# Patient Record
Sex: Female | Born: 1960 | Race: White | Hispanic: No | Smoking: Current every day smoker
Health system: Southern US, Community
[De-identification: ages and names within clinical notes are randomized; demographics above are authoritative.]

## PROBLEM LIST (undated history)

## (undated) HISTORY — PX: CHOLECYSTECTOMY: SHX55

## (undated) HISTORY — PX: TONSILLECTOMY: SUR1361

---

## 1997-09-06 ENCOUNTER — Emergency Department (HOSPITAL_COMMUNITY): Admission: EM | Admit: 1997-09-06 | Discharge: 1997-09-06 | Payer: Self-pay | Admitting: Emergency Medicine

## 1998-09-28 ENCOUNTER — Ambulatory Visit (HOSPITAL_COMMUNITY): Admission: RE | Admit: 1998-09-28 | Discharge: 1998-09-28 | Payer: Self-pay | Admitting: Gastroenterology

## 1999-08-03 ENCOUNTER — Emergency Department (HOSPITAL_COMMUNITY): Admission: EM | Admit: 1999-08-03 | Discharge: 1999-08-03 | Payer: Self-pay

## 1999-09-12 ENCOUNTER — Encounter: Payer: Self-pay | Admitting: Gastroenterology

## 1999-09-12 ENCOUNTER — Ambulatory Visit (HOSPITAL_COMMUNITY): Admission: RE | Admit: 1999-09-12 | Discharge: 1999-09-12 | Payer: Self-pay | Admitting: Gastroenterology

## 1999-09-29 ENCOUNTER — Ambulatory Visit (HOSPITAL_COMMUNITY): Admission: RE | Admit: 1999-09-29 | Discharge: 1999-09-30 | Payer: Self-pay | Admitting: General Surgery

## 1999-09-29 ENCOUNTER — Encounter: Payer: Self-pay | Admitting: General Surgery

## 2006-11-25 ENCOUNTER — Emergency Department: Payer: Self-pay | Admitting: Emergency Medicine

## 2008-08-19 IMAGING — CR RIGHT MIDDLE FINGER 2+V
1 series · 3 of 3 positions shown · non-contrast
Comparison: none

REASON FOR EXAM: injury
COMMENTS:   LMP: Post-Menopausal

PROCEDURE:     DXR - DXR FINGER MID 3RD DIGIT RT HAND  - November 25, 2006 [DATE]
RESULT:     A distal tuft fracture is present.  There is a soft tissue
laceration.

[Series 1: view not recorded · 0.17mm/px · 3 of 3 slices shown]
[im 1/3]
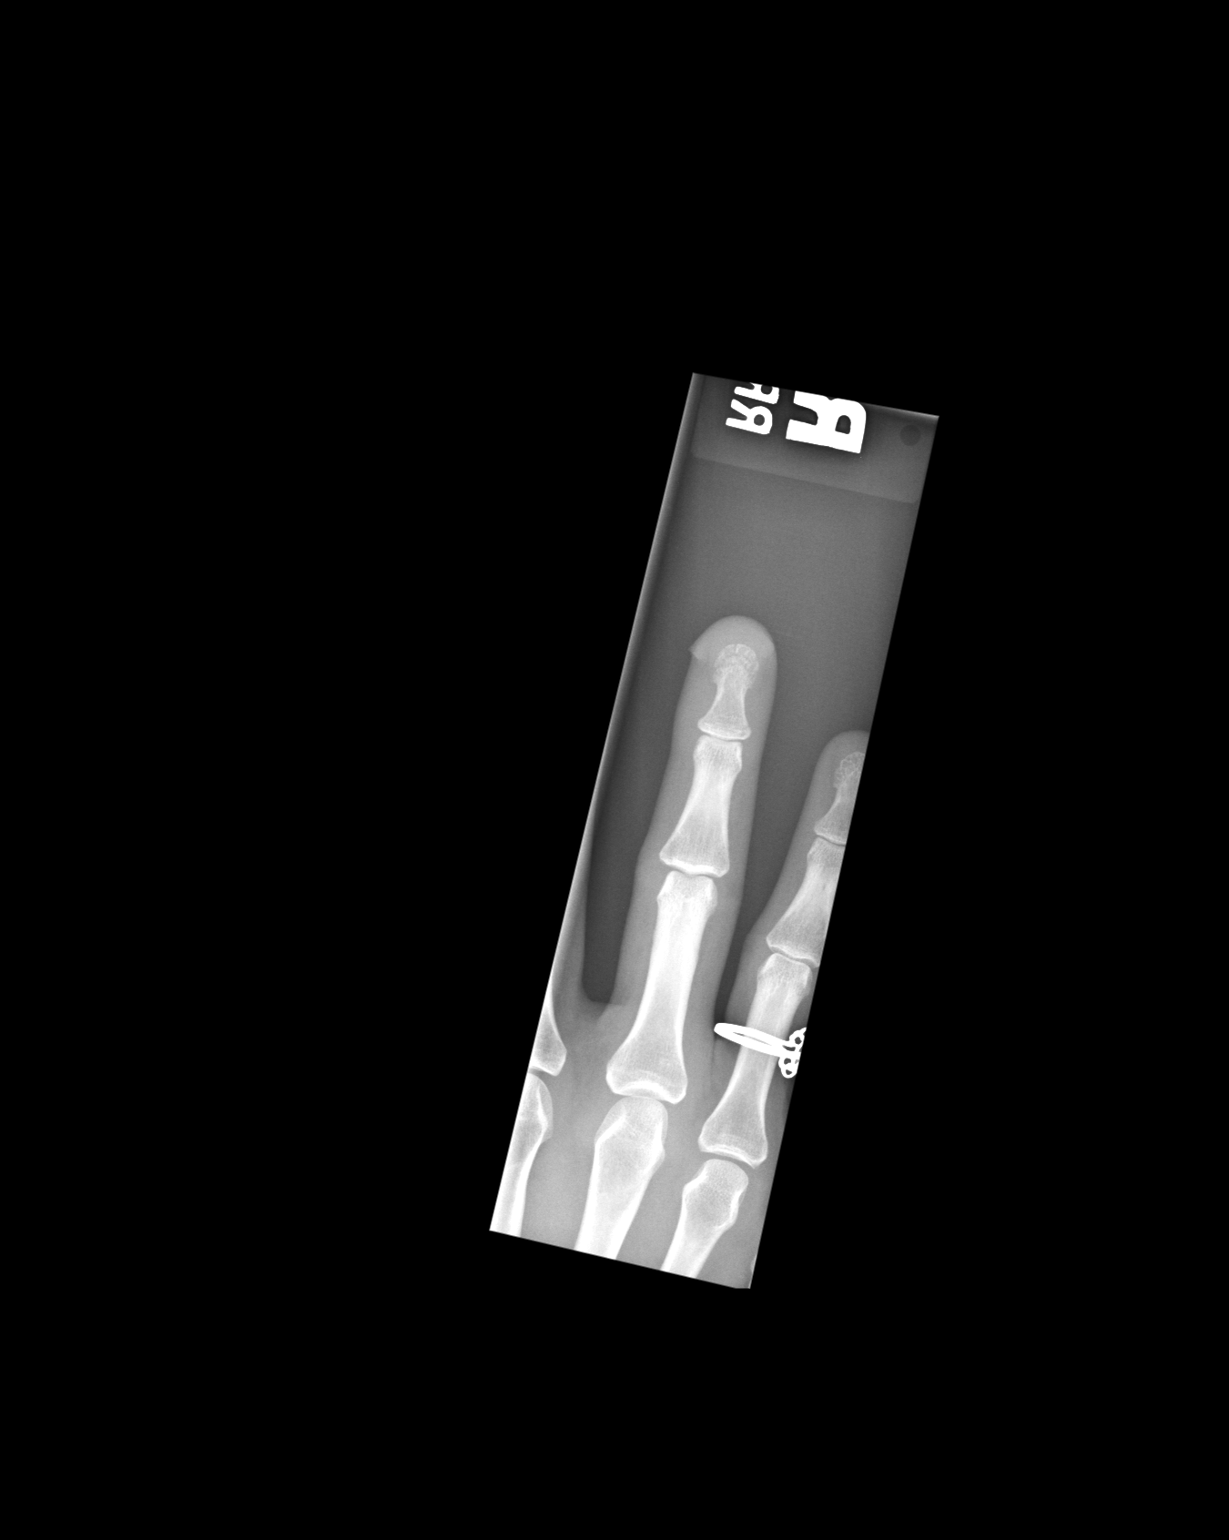
[im 2/3]
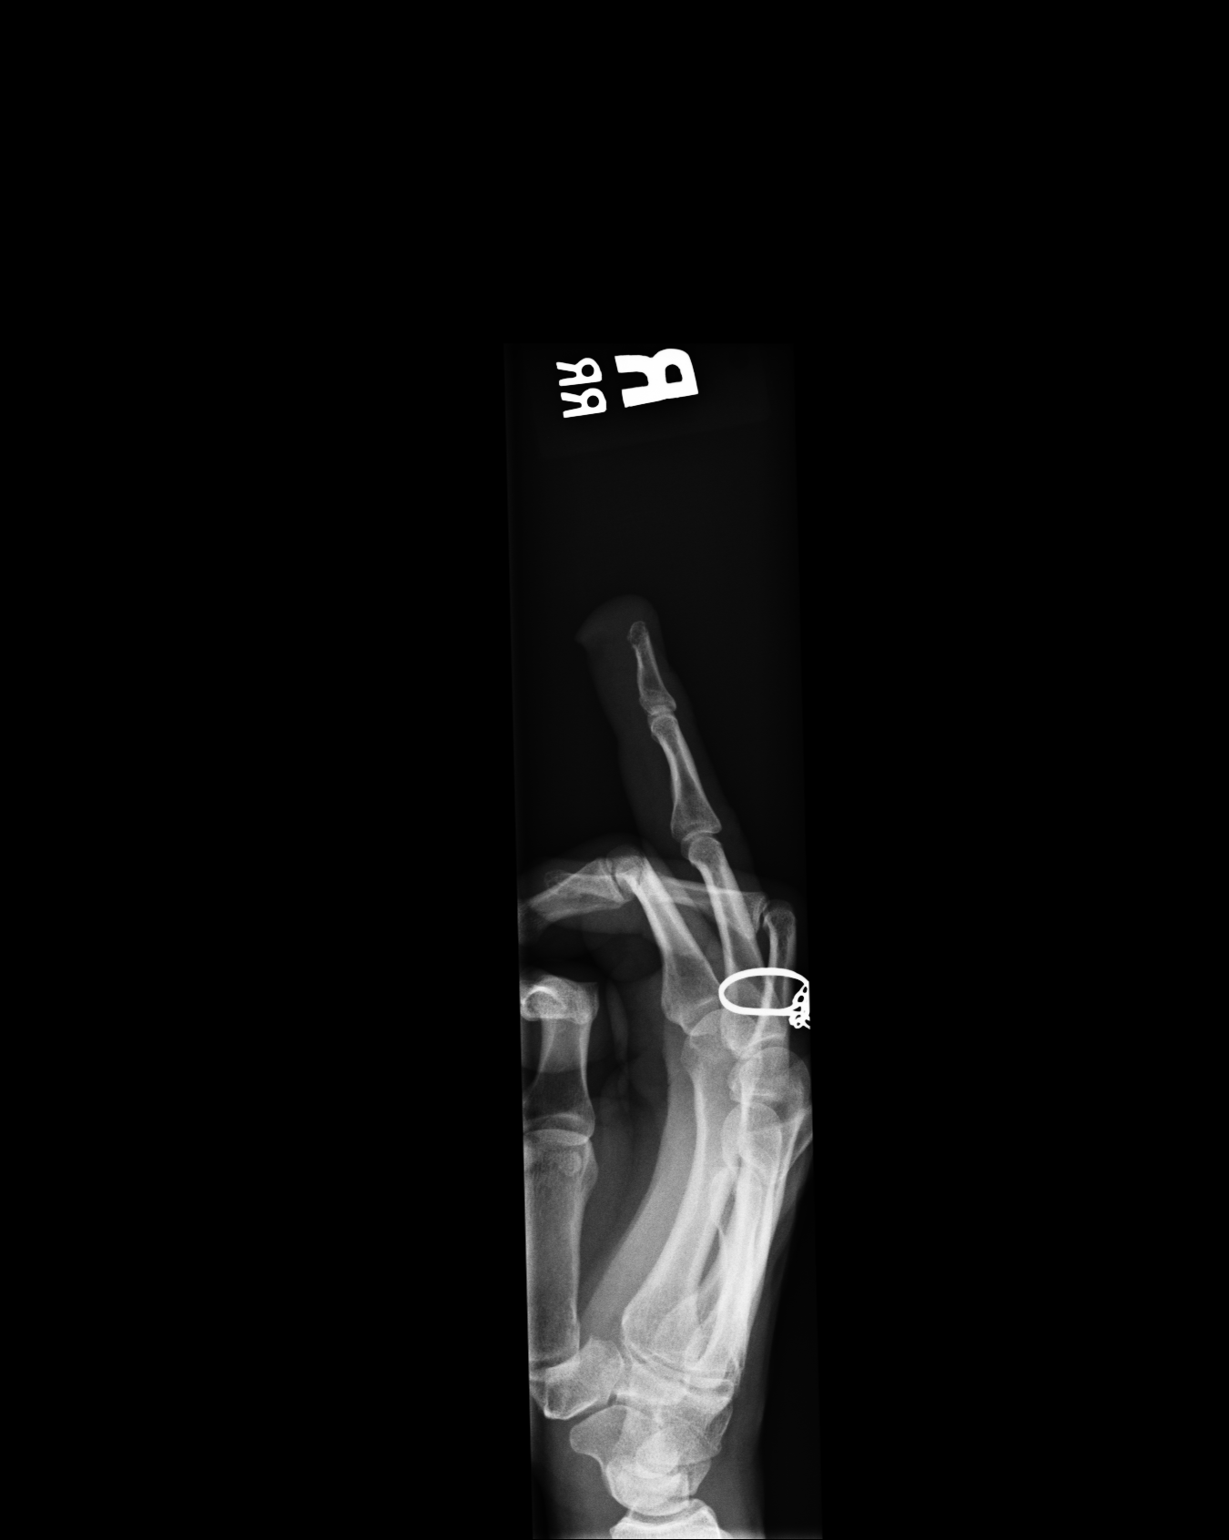
[im 3/3]
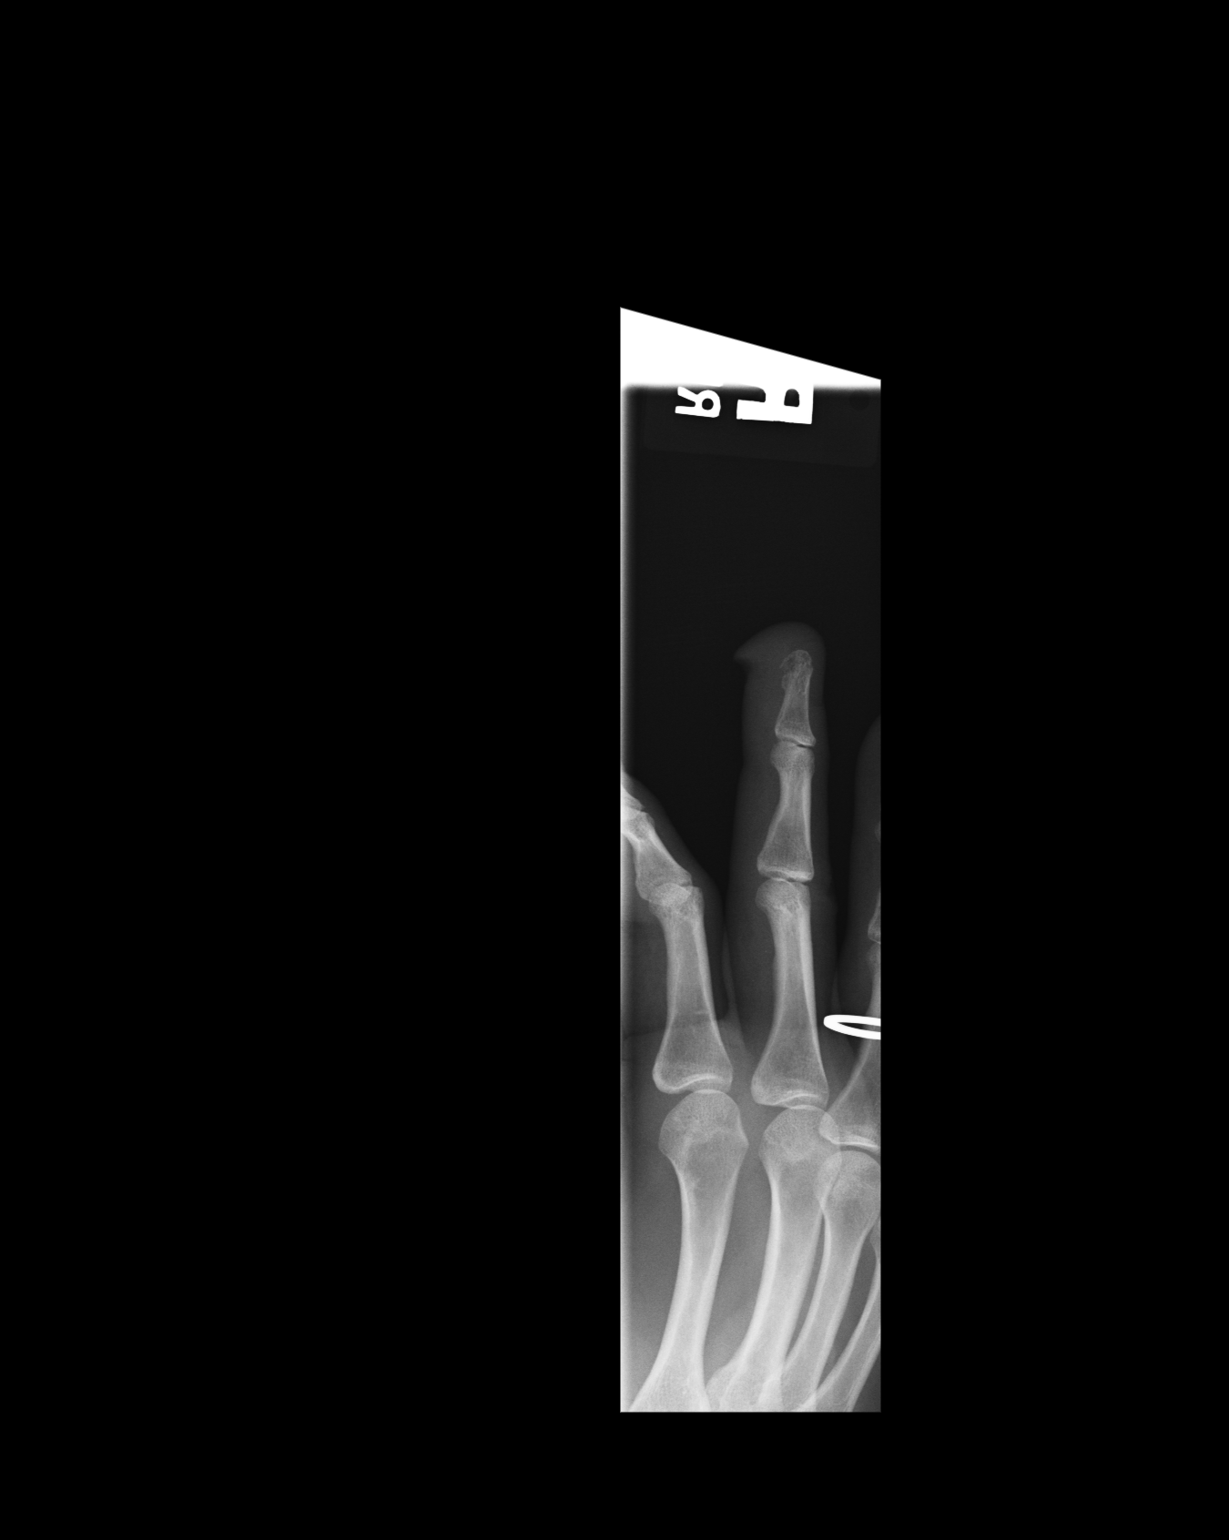

[3 of 3 positions shown; findings below may reference images not displayed]

IMPRESSION: 1)Fracture of the distal tuft of the RIGHT third digit with associated
laceration.  The fracture is non-displaced.

## 2010-07-08 ENCOUNTER — Emergency Department: Payer: Self-pay | Admitting: Emergency Medicine

## 2010-07-08 ENCOUNTER — Emergency Department (HOSPITAL_COMMUNITY)
Admission: EM | Admit: 2010-07-08 | Discharge: 2010-07-08 | Disposition: A | Discharge status: Home / Self Care | Payer: BC Managed Care – PPO | Attending: Emergency Medicine | Admitting: Emergency Medicine

## 2010-07-08 DIAGNOSIS — X58XXXA Exposure to other specified factors, initial encounter: Secondary | ICD-10-CM | POA: Insufficient documentation

## 2010-07-08 DIAGNOSIS — K089 Disorder of teeth and supporting structures, unspecified: Secondary | ICD-10-CM | POA: Insufficient documentation

## 2010-07-08 DIAGNOSIS — S025XXA Fracture of tooth (traumatic), initial encounter for closed fracture: Secondary | ICD-10-CM | POA: Insufficient documentation

## 2022-01-19 ENCOUNTER — Encounter: Payer: Self-pay | Admitting: Emergency Medicine

## 2022-01-19 ENCOUNTER — Ambulatory Visit
Admission: EM | Admit: 2022-01-19 | Discharge: 2022-01-19 | Disposition: A | Discharge status: Home / Self Care | Payer: 59 | Attending: Urgent Care | Admitting: Urgent Care

## 2022-01-19 ENCOUNTER — Other Ambulatory Visit: Payer: Self-pay

## 2022-01-19 DIAGNOSIS — J329 Chronic sinusitis, unspecified: Secondary | ICD-10-CM

## 2022-01-19 DIAGNOSIS — J310 Chronic rhinitis: Secondary | ICD-10-CM

## 2022-01-19 DIAGNOSIS — J029 Acute pharyngitis, unspecified: Secondary | ICD-10-CM | POA: Diagnosis not present

## 2022-01-19 LAB — POCT RAPID STREP A (OFFICE): Rapid Strep A Screen: NEGATIVE

## 2022-01-19 MED ORDER — NYSTATIN 100000 UNIT/ML MT SUSP: 5.0000 mL | Freq: Three times a day (TID) | OROMUCOSAL | 0 refills | Status: AC

## 2022-01-19 MED ORDER — AMOXICILLIN-POT CLAVULANATE 875-125 MG PO TABS: 1.0000 | ORAL_TABLET | Freq: Two times a day (BID) | ORAL | 0 refills | Status: AC

## 2022-01-19 NOTE — Discharge Instructions (Addendum)
Follow-up with your primary care provider if your symptoms do not respond to treatment.  Recommend evaluation at ENT for oral irritation.

## 2022-01-19 NOTE — ED Triage Notes (Addendum)
Patient c/o sore throat and generalized body aches x 1 week.   Patient endorses " low grade fever" at home.   Patient states " I have bumps on the back of my throat".   Patient endorses painful swallowing.   Patient endorses chest congestion.   Patient has taken OTC medicines with no relief of symptoms.

## 2022-01-19 NOTE — ED Provider Notes (Signed)
Stephanie Fernandez    CSN: [id number] Arrival date & time: 01/19/22  1524      History   Chief Complaint Chief Complaint  Patient presents with   Sore Throat   Generalized Body Aches    HPI Stephanie Fernandez is a 61 y.o. female.   HPI  Presents to urgent care with symptoms x2 weeks, including sore throat and myalgia, cough productive of yellow sputum.  She endorses "low-grade fever" measured at home.  Painful swallowing.  Chest congestion.  Complains of sore tongue, roof of mouth with "bumps" on the back of her tongue.  Denies abdominal pain, nausea, vomiting, diarrhea.  History reviewed. No pertinent past medical history.  There are no problems to display for this patient.   Past Surgical History:  Procedure Laterality Date   CHOLECYSTECTOMY     TONSILLECTOMY      OB History   No obstetric history on file.      Home Medications    Prior to Admission medications   Not on File    Family History No family history on file.  Social History Social History   Tobacco Use   Smoking status: Every Day    Packs/day: 0.50    Years: 40.00    Total pack years: 20.00    Types: Cigarettes   Smokeless tobacco: Never  Substance Use Topics   Alcohol use: Not Currently   Drug use: Never     Allergies   Patient has no known allergies.   Review of Systems Review of Systems   Physical Exam Triage Vital Signs ED Triage Vitals [01/19/22 1547]  Enc Vitals Group     BP      Pulse      Resp      Temp      Temp src      SpO2      Weight 125 lb (56.7 kg)     Height 5\' 4"  (1.626 m)     Head Circumference      Peak Flow      Pain Score 7     Pain Loc      Pain Edu?      Excl. in [place]?    No data found.  Updated Vital Signs Ht 5\' 4"  (1.626 m)   Wt 125 lb (56.7 kg)   LMP  (LMP Unknown)   BMI 21.46 kg/m   Visual Acuity Right Eye Distance:   Left Eye Distance:   Bilateral Distance:    Right Eye Near:   Left Eye Near:    Bilateral Near:      Physical Exam   UC Treatments / Results  Labs (all labs ordered are listed, but only abnormal results are displayed) Labs Reviewed - No data to display  EKG   Radiology No results found.  Procedures Procedures (including critical care time)  Medications Ordered in UC Medications - No data to display  Initial Impression / Assessment and Plan / UC Course  I have reviewed the triage vital signs and the nursing notes.  Pertinent labs & imaging results that were available during my care of the patient were reviewed by me and considered in my medical decision making (see chart for details).   Unclear etiology for her symptoms.  Rapid strep is negative.  I have ordered an antibiotic for what I presume is bacterial sinusitis given the duration of her symptoms.  I am unclear whether her oral irritation is chronic versus related to her  respiratory symptoms and sore throat.  I have encouraged her to quit smoking as tobacco use is certainly not making her symptoms any better.  Prescribing a Magic mouthwash to provide some relief to her oral mucosal pain.  Recommended she follow-up with her primary care provider for possible referral to ENT.   Final Clinical Impressions(s) / UC Diagnoses   Final diagnoses:  None   Discharge Instructions   None    ED Prescriptions   None    PDMP not reviewed this encounter.   Rose Phi, [place] 01/19/22 1622

## 2022-01-21 LAB — CULTURE, GROUP A STREP (THRC)

## 2022-01-22 LAB — CULTURE, GROUP A STREP (THRC)

## 2024-06-05 ENCOUNTER — Ambulatory Visit

## 2024-06-05 VITALS — BP 138/84 | HR 93 | Temp 98.0°F | Ht 62.5 in | Wt 127.0 lb

## 2024-06-05 DIAGNOSIS — J302 Other seasonal allergic rhinitis: Secondary | ICD-10-CM | POA: Insufficient documentation

## 2024-06-05 DIAGNOSIS — Z136 Encounter for screening for cardiovascular disorders: Secondary | ICD-10-CM

## 2024-06-05 DIAGNOSIS — L821 Other seborrheic keratosis: Secondary | ICD-10-CM

## 2024-06-05 DIAGNOSIS — F17200 Nicotine dependence, unspecified, uncomplicated: Secondary | ICD-10-CM

## 2024-06-05 DIAGNOSIS — Z803 Family history of malignant neoplasm of breast: Secondary | ICD-10-CM | POA: Insufficient documentation

## 2024-06-05 DIAGNOSIS — E538 Deficiency of other specified B group vitamins: Secondary | ICD-10-CM

## 2024-06-05 DIAGNOSIS — Z1231 Encounter for screening mammogram for malignant neoplasm of breast: Secondary | ICD-10-CM

## 2024-06-05 DIAGNOSIS — Z8669 Personal history of other diseases of the nervous system and sense organs: Secondary | ICD-10-CM | POA: Insufficient documentation

## 2024-06-05 DIAGNOSIS — Z131 Encounter for screening for diabetes mellitus: Secondary | ICD-10-CM

## 2024-06-05 DIAGNOSIS — Z113 Encounter for screening for infections with a predominantly sexual mode of transmission: Secondary | ICD-10-CM

## 2024-06-05 DIAGNOSIS — E559 Vitamin D deficiency, unspecified: Secondary | ICD-10-CM

## 2024-06-05 MED ORDER — NICOTINE POLACRILEX 4 MG MT GUM: 4.0000 mg | CHEWING_GUM | OROMUCOSAL | 3 refills | Status: AC | PRN

## 2024-06-05 MED ORDER — BUTALBITAL-APAP-CAFFEINE 50-325-40 MG PO TABS: 1.0000 | ORAL_TABLET | ORAL | 0 refills | Status: AC | PRN

## 2024-06-05 MED ORDER — NICOTINE 21 MG/24HR TD PT24: 21.0000 mg | MEDICATED_PATCH | Freq: Every day | TRANSDERMAL | 1 refills | Status: AC

## 2024-06-05 MED ORDER — FLUTICASONE PROPIONATE 50 MCG/ACT NA SUSP: 1.0000 | Freq: Two times a day (BID) | NASAL | 0 refills | Status: AC

## 2024-06-05 NOTE — Assessment & Plan Note (Signed)
 Reportedly had subsided for years, now returned in the setting of life stressors.  She was on Fioricet several years ago with reported efficacy, will restart at this time.  Orders:   butalbital -acetaminophen-caffeine  (FIORICET) 50-325-40 MG tablet; Take 1 tablet by mouth every 4 (four) hours as needed for headache.

## 2024-06-05 NOTE — Assessment & Plan Note (Signed)
 Family history of breast cancer in her mother, breast cancer restratification using the Alisa model places her at 3.3%, higher than average, counseled patient accordingly.  Screening mammogram ordered as she has never had one.  Orders:   MM 3D SCREENING MAMMOGRAM BILATERAL BREAST; Future

## 2024-06-05 NOTE — Assessment & Plan Note (Signed)
 Chronic Runny nose and sneezing, likely due to allergies, starting with intranasal steroid.  If symptoms remain unresolved, would add oral antihistamine.  Orders:   fluticasone  (FLONASE ) 50 MCG/ACT nasal spray; Place 1 spray into both nostrils 2 (two) times daily.

## 2024-06-05 NOTE — Patient Instructions (Addendum)
 Thank you for visiting [facility] today! Here's what we talked about: - Call [phone] if you do not hear from them in 2 weeks about mammogram - START Nicotine  patches and gum and Fioricet

## 2024-06-05 NOTE — Progress Notes (Signed)
 "  Subjective:   This visit was conducted in person. The patient gave informed consent to the use of [employer] AI technology to record the contents of the encounter as documented below.   Patient ID: Stephanie Fernandez, female    DOB: [birth year], 64 y.o.   MRN: [id number]   Stephanie Fernandez is a very pleasant 64 y.o. female who presents today as a new patient.  Past medical, surgical and family history: Reviewed and updated in chart.  Allergies: Reviewed and updated in chart.  Medications: Reviewed and updated in chart.  Social history: Reviewed and updated in chart.  Last PCP and reason for leaving: Last seen at [facility] more than 10 years ago.   Discussed the use of AI scribe software for clinical note transcription with the patient, who gave verbal consent to proceed.  History of Present Illness Stephanie Fernandez is a 64 year old female who presents for a health review and management of headaches and allergies.  She experiences stress-related tension headaches, typically located at the temples and sometimes extending from the shoulders up the neck. She reports experiencing stress and family challenges, including the passing of her husband seven months ago, and believes her headaches may be related to stress. She has a history of severe migraines characterized by vomiting, photophobia, and the need to stay in a dark room, but has not experienced such migraines recently. She reports that a medication containing butalbital  helped her headaches in the past.  She takes several over-the-counter supplements, including vitamin B12, Magic Mushroom Mix for brain focus, turmeric, and vitamin D3, but is unsure about the proper dosage. She also takes Sudafed for congestion related to seasonal allergies, which manifest as a runny or stuffy nose and sneezing. She has not tried other allergy medications like Claritin or Zyrtec.  Her family history includes her mother having had non-Hodgkin's  lymphoma and breast cancer, and her father having had a heart attack at the age of 9. She has a sister with COPD and another with heart disease. Despite her family history, she has not had a mammogram in many years.  Socially, she lives with her daughter, who has schizophrenia, and her granddaughter. 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 She smokes half a pack to a full pack of cigarettes daily and has attempted to quit using patches in the past. She wants to quit smoking.  She has a skin lesion on her back that has been present for many years. It is not painful or itchy, and she cannot recall if it has changed in size or color. She has a history of skin cancer on her toe, which was removed along with her toenail.          Review of Systems  All other systems reviewed and are negative.       BP 138/84 (BP Location: Left Arm, Patient Position: Sitting, Cuff Size: Normal)   Pulse 93   Temp 98 F (36.7 C) (Oral)   Ht 5' 2.5 (1.588 m)   Wt 127 lb (57.6 kg)   LMP  (LMP Unknown)   SpO2 93%   BMI 22.86 kg/m   Objective:     Physical Exam VITALS: BP- 138/84 GENERAL: Alert, cooperative, well developed, no acute distress. HEAD: Normocephalic atraumatic. EYES: Extraocular movements intact bilaterally, pupils round, equal and reactive to light bilaterally, conjunctivae normal bilaterally. EXTREMITIES: No cyanosis or edema. NEUROLOGICAL: Oriented to person, place and time, no gait abnormalities,  moves all extremities without gross motor or sensory deficit. SKIN: Seborrheic keratosis on back, scaly, not painful, not itchy, no change in size or color.         Assessment & Plan:   New patient, past medical and social history thoroughly reviewed and updated in chart.   Assessment & Plan Current every day smoker See the 5As discussed below.  Not agreeable to medications, we will just treat with NRT. Asked about tobacco use, patient confirmed using 10-20  cigarettes daily Advised to quit given harmful health effects including increased risk of various cancers, heart disease and lung disease Assessed readiness to quit, willing to make a quit attempt at this time Assisting her to quit with the following plan: NRT patch and gum Arranged scheduled follow-up in 4 to monitor progress Ordering monitoring labs as below   Orders:   nicotine  (NICODERM CQ  - DOSED IN MG/24 HOURS) 21 mg/24hr patch; Place 1 patch (21 mg total) onto the skin daily.   nicotine  polacrilex (NICORETTE ) 4 MG gum; Take 1 each (4 mg total) by mouth as needed for smoking cessation.   CBC with Differential; Future   Comprehensive metabolic panel with GFR; Future  Seborrheic keratosis See media and physical exam, low concern for malignancy at this time, gave instructions on red flag symptoms that would prompt further evaluation including changing size/color variation, bleeding, nonhealing.    B12 deficiency Currently on OTC supplementation, ordering levels for monitoring - Continue current supplementation Orders:   Vitamin B12; Future  Vitamin D deficiency Currently on supplementation, ordering levels for monitoring - Continue current supplementation Orders:   VITAMIN D 25 Hydroxy (Vit-D Deficiency, Fractures); Future  Seasonal allergies Chronic Runny nose and sneezing, likely due to allergies, starting with intranasal steroid.  If symptoms remain unresolved, would add oral antihistamine.  Orders:   fluticasone  (FLONASE ) 50 MCG/ACT nasal spray; Place 1 spray into both nostrils 2 (two) times daily.  Hx of migraines Reportedly had subsided for years, now returned in the setting of life stressors.  She was on Fioricet several years ago with reported efficacy, will restart at this time.  Orders:   butalbital -acetaminophen-caffeine  (FIORICET) 50-325-40 MG tablet; Take 1 tablet by mouth every 4 (four) hours as needed for headache.  Family hx-breast malignancy Breast cancer  screening by mammogram Family history of breast cancer in her mother, breast cancer restratification using the Alisa model places her at 3.3%, higher than average, counseled patient accordingly.  Screening mammogram ordered as she has never had one.  Orders:   MM 3D SCREENING MAMMOGRAM BILATERAL BREAST; Future  Diabetes mellitus screening Screen for STD (sexually transmitted disease) Screening for cardiovascular condition See age-appropriate screening as below in preparation for upcoming physical.  Orders:   Hemoglobin A1c; Future   Hepatitis C antibody; Future   HIV Antibody (routine testing w rflx); Future   Lipid panel; Future    Return in about 4 weeks (around 07/03/2024) for CPE and smoking, fasting labs 1wk prior.   Meosha Castanon K Fuad Forget, MD  06/05/24    Contains text generated by [employer].    "

## 2024-07-07 ENCOUNTER — Other Ambulatory Visit

## 2024-07-14 ENCOUNTER — Encounter
